# Patient Record
Sex: Female | Born: 1985 | Race: White | Hispanic: No | Marital: Single | Smoking: Current every day smoker
Health system: Southern US, Community
[De-identification: ages and names within clinical notes are randomized; demographics above are authoritative.]

## PROBLEM LIST (undated history)

## (undated) ENCOUNTER — Inpatient Hospital Stay (HOSPITAL_COMMUNITY): Payer: Self-pay

## (undated) DIAGNOSIS — Z789 Other specified health status: Secondary | ICD-10-CM

## (undated) HISTORY — PX: NO PAST SURGERIES: SHX2092

---
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
## 2007-06-27 ENCOUNTER — Other Ambulatory Visit: Admission: RE | Admit: 2007-06-27 | Discharge: 2007-06-27 | Payer: Self-pay | Admitting: Family Medicine

## 2007-06-27 ENCOUNTER — Ambulatory Visit: Payer: Self-pay | Admitting: Family Medicine

## 2007-06-27 ENCOUNTER — Encounter: Payer: Self-pay | Admitting: Family Medicine

## 2007-07-25 ENCOUNTER — Ambulatory Visit: Payer: Self-pay | Admitting: Family Medicine

## 2007-08-15 ENCOUNTER — Ambulatory Visit: Payer: Self-pay | Admitting: Family Medicine

## 2007-11-25 ENCOUNTER — Ambulatory Visit: Payer: Self-pay | Admitting: Family Medicine

## 2007-11-28 ENCOUNTER — Ambulatory Visit: Payer: Self-pay | Admitting: Family Medicine

## 2008-10-08 ENCOUNTER — Emergency Department (HOSPITAL_BASED_OUTPATIENT_CLINIC_OR_DEPARTMENT_OTHER): Admission: EM | Admit: 2008-10-08 | Discharge: 2008-10-08 | Payer: Self-pay | Admitting: Emergency Medicine

## 2010-07-10 LAB — CBC
HCT: 39.6 % (ref 36.0–46.0)
Hemoglobin: 13.6 g/dL (ref 12.0–15.0)
MCHC: 34.4 g/dL (ref 30.0–36.0)
MCV: 92.4 fL (ref 78.0–100.0)
Platelets: 183 10*3/uL (ref 150–400)
RBC: 4.28 MIL/uL (ref 3.87–5.11)
RDW: 12.7 % (ref 11.5–15.5)
WBC: 13.3 10*3/uL — ABNORMAL HIGH (ref 4.0–10.5)

## 2010-07-10 LAB — URINALYSIS, ROUTINE W REFLEX MICROSCOPIC
Bilirubin Urine: NEGATIVE
Glucose, UA: NEGATIVE mg/dL
Hgb urine dipstick: NEGATIVE
Ketones, ur: NEGATIVE mg/dL
Nitrite: NEGATIVE
Protein, ur: NEGATIVE mg/dL
Specific Gravity, Urine: 1.004 — ABNORMAL LOW (ref 1.005–1.030)
Urobilinogen, UA: 0.2 mg/dL (ref 0.0–1.0)
pH: 6.5 (ref 5.0–8.0)

## 2010-07-10 LAB — BASIC METABOLIC PANEL
BUN: 7 mg/dL (ref 6–23)
CO2: 27 mEq/L (ref 19–32)
Chloride: 108 mEq/L (ref 96–112)
Creatinine, Ser: 0.8 mg/dL (ref 0.4–1.2)
Potassium: 4 mEq/L (ref 3.5–5.1)

## 2010-07-10 LAB — BASIC METABOLIC PANEL WITH GFR
Calcium: 9.2 mg/dL (ref 8.4–10.5)
GFR calc Af Amer: >60 mL/min (ref >60)
GFR calc non Af Amer: >60 mL/min (ref >60)
Glucose, Bld: 93 mg/dL (ref 70–99)
Sodium: 144 meq/L (ref 135–145)

## 2010-07-10 LAB — DIFFERENTIAL
Basophils Absolute: 0 K/uL (ref 0.0–0.1)
Basophils Relative: 0 % (ref 0–1)
Eosinophils Absolute: 0 10*3/uL (ref 0.0–0.7)
Eosinophils Relative: 0 % (ref 0–5)
Lymphocytes Relative: 19 % (ref 12–46)
Lymphs Abs: 2.5 10*3/uL (ref 0.7–4.0)
Monocytes Absolute: 0.7 K/uL (ref 0.1–1.0)
Monocytes Relative: 5 % (ref 3–12)
Neutro Abs: 10.1 K/uL — ABNORMAL HIGH (ref 1.7–7.7)
Neutrophils Relative %: 76 % (ref 43–77)

## 2010-07-10 LAB — URINE MICROSCOPIC-ADD ON

## 2010-07-10 LAB — PREGNANCY, URINE: Preg Test, Ur: NEGATIVE

## 2012-07-04 ENCOUNTER — Emergency Department (HOSPITAL_BASED_OUTPATIENT_CLINIC_OR_DEPARTMENT_OTHER)
Admission: EM | Admit: 2012-07-04 | Discharge: 2012-07-05 | Disposition: A | Discharge status: Home / Self Care | Payer: BC Managed Care – PPO | Attending: Emergency Medicine | Admitting: Emergency Medicine

## 2012-07-04 ENCOUNTER — Encounter (HOSPITAL_BASED_OUTPATIENT_CLINIC_OR_DEPARTMENT_OTHER): Payer: Self-pay | Admitting: Emergency Medicine

## 2012-07-04 DIAGNOSIS — L02214 Cutaneous abscess of groin: Secondary | ICD-10-CM

## 2012-07-04 DIAGNOSIS — O9989 Other specified diseases and conditions complicating pregnancy, childbirth and the puerperium: Secondary | ICD-10-CM | POA: Insufficient documentation

## 2012-07-04 DIAGNOSIS — L03319 Cellulitis of trunk, unspecified: Secondary | ICD-10-CM | POA: Insufficient documentation

## 2012-07-04 DIAGNOSIS — O99330 Smoking (tobacco) complicating pregnancy, unspecified trimester: Secondary | ICD-10-CM | POA: Insufficient documentation

## 2012-07-04 DIAGNOSIS — Z79899 Other long term (current) drug therapy: Secondary | ICD-10-CM | POA: Insufficient documentation

## 2012-07-04 DIAGNOSIS — L02219 Cutaneous abscess of trunk, unspecified: Secondary | ICD-10-CM | POA: Insufficient documentation

## 2012-07-04 NOTE — ED Notes (Signed)
Abscess in groin x2 days.  No drainage.

## 2012-07-05 MED ORDER — HYDROCODONE-ACETAMINOPHEN 5-325 MG PO TABS
ORAL_TABLET | ORAL | Status: AC
  Administered 2012-07-05: 1 via ORAL
  Filled 2012-07-05: qty 1

## 2012-07-05 MED ORDER — HYDROCODONE-ACETAMINOPHEN 5-325 MG PO TABS
1.0000 | ORAL_TABLET | Freq: Once | ORAL | Status: AC
  Administered 2012-07-05: 1 via ORAL

## 2012-07-05 NOTE — ED Notes (Signed)
MD at bedside. 

## 2012-07-05 NOTE — ED Provider Notes (Signed)
History     CSN: [id number]  Arrival date & time 07/04/12  2347   First MD Initiated Contact with Patient 07/05/12 0001      Chief Complaint  Patient presents with  . Abscess    (Consider location/radiation/quality/duration/timing/severity/associated sxs/prior treatment) HPI This is a 27 year old female who is about [redacted] weeks pregnant. She has a 2 day history of an abscess in the right groin fold. The abscess is pointing. There is moderate to severe pain associated with it particularly when walking. She denies systemic symptoms such as fever or chills. There's been no drainage.  History reviewed. No pertinent past medical history.  History reviewed. No pertinent past surgical history.  No family history on file.  History  Substance Use Topics  . Smoking status: Current Every Day Smoker -- 0.75 packs/day  . Smokeless tobacco: Not on file  . Alcohol Use: No    OB History   Grav Para Term Preterm Abortions TAB SAB Ect Mult Living   1               Review of Systems  All other systems reviewed and are negative.    Allergies  Latex; Betadine; and Iodine  Home Medications   Current Outpatient Rx  Name  Route  Sig  Dispense  Refill  . ferrous sulfate 325 (65 FE) MG EC tablet   Oral   Take 325 mg by mouth 3 (three) times daily with meals.         . Prenatal Vit-Fe Fumarate-FA (PRENATAL MULTIVITAMIN) TABS   Oral   Take 1 tablet by mouth daily at 12 noon.           BP 136/69  Pulse 92  Temp(Src) 98.5 F (36.9 C) (Oral)  Resp 20  Ht 5\' 8"  (1.727 m)  Wt 215 lb (97.523 kg)  BMI 32.7 kg/m2  SpO2 98%  Physical Exam General: Well-developed, well-nourished female in no acute distress; appearance consistent with age of record HENT: normocephalic, atraumatic Eyes: Normal appearance Neck: supple Heart: regular rate and rhythm Lungs: clear to auscultation bilaterally Abdomen: soft; gravid, consistent with dates; nontender; bowel sounds present GU: Normal  external genitalia; the pointing abscess of right groin fold without involvement of the right labium majus Extremities: No deformity; full range of motion Neurologic: Awake, alert and oriented; motor function intact in all extremities and symmetric; no facial droop Skin: Warm and dry Psychiatric: Normal mood and affect    ED Course  Procedures (including critical care time)  INCISION AND DRAINAGE Performed by: Paula Libra L Consent: Verbal consent obtained. Risks and benefits: risks, benefits and alternatives were discussed Type: abscess  Body area: Right groin fold  Anesthesia: local infiltration  Incision was made with a scalpel.  Local anesthetic: lidocaine 2 % with epinephrine  Anesthetic total: 2 ml  Complexity: complex Blunt dissection to break up loculations  Drainage: purulent, foul-smelling   Drainage amount: Moderate   Packing material: 1/4 in iodoform gauze  Patient tolerance: Patient tolerated the procedure well with no immediate complications.     MDM   Patient was advised to remove the packing in 3 days. She was advised to return to the ED if symptoms worsen rub improve during that period.       Hanley Seamen, MD 07/05/12 [phone]

## 2012-07-05 NOTE — ED Notes (Signed)
Assisted MD with I&D. Pt tolerated well.

## 2012-10-15 ENCOUNTER — Inpatient Hospital Stay (HOSPITAL_COMMUNITY)
Admission: AD | Admit: 2012-10-15 | Discharge: 2012-10-15 | Disposition: A | Discharge status: Home / Self Care | Payer: BC Managed Care – PPO | Source: Ambulatory Visit | Attending: Family Medicine | Admitting: Family Medicine

## 2012-10-15 ENCOUNTER — Encounter (HOSPITAL_COMMUNITY): Payer: Self-pay

## 2012-10-15 DIAGNOSIS — IMO0002 Reserved for concepts with insufficient information to code with codable children: Secondary | ICD-10-CM | POA: Insufficient documentation

## 2012-10-15 DIAGNOSIS — M79609 Pain in unspecified limb: Secondary | ICD-10-CM | POA: Insufficient documentation

## 2012-10-15 DIAGNOSIS — R609 Edema, unspecified: Secondary | ICD-10-CM

## 2012-10-15 DIAGNOSIS — R600 Localized edema: Secondary | ICD-10-CM

## 2012-10-15 HISTORY — DX: Other specified health status: Z78.9

## 2012-10-15 LAB — URINE MICROSCOPIC-ADD ON

## 2012-10-15 LAB — URINALYSIS, ROUTINE W REFLEX MICROSCOPIC: Nitrite: NEGATIVE

## 2012-10-15 MED ORDER — ENOXAPARIN SODIUM 80 MG/0.8ML ~~LOC~~ SOLN
80.0000 mg | Freq: Once | SUBCUTANEOUS | Status: AC
  Administered 2012-10-15: 80 mg via SUBCUTANEOUS
  Filled 2012-10-15: qty 0.8

## 2012-10-15 NOTE — MAU Note (Signed)
Patient gets her prenatal care in [place]. States she has been having increasing swelling in both legs and feet and now having pain in both legs. Denies contractions, bleeding and has a little vaginal discharge. Some mild back pain. Patient reports good fetal movement.

## 2012-10-15 NOTE — MAU Provider Note (Signed)
History     CSN: [id number]  Arrival date and time: 10/15/12 1550   First Provider Initiated Contact with Patient 10/15/12 1913      Chief Complaint  Patient presents with  . Leg Swelling  . Leg Pain   HPI This is a 27 y.o. female at [redacted]w[redacted]d who presents with c/o bilateral leg swelling, calf pain, intermittent numbness of legs and feet and numbness of hands (intermittent). States legs have been swollen entire pregnancy but worse over last few days. States has trouble walking due to swelling and numbness. States gets care in [place] but was here visiting mom so came here. No dyspnea.   RN Note: Patient gets her prenatal care in [place]. States she has been having increasing swelling in both legs and feet and now having pain in both legs. Denies contractions, bleeding and has a little vaginal discharge. Some mild back pain. Patient reports good fetal movement.       OB History   Grav Para Term Preterm Abortions TAB SAB Ect Mult Living   1 0 0 0 0 0 0 0 0 0       Past Medical History  Diagnosis Date  . Medical history non-contributory     Past Surgical History  Procedure Laterality Date  . No past surgeries      No family history on file.  History  Substance Use Topics  . Smoking status: Current Every Day Smoker -- 0.75 packs/day    Types: Cigarettes  . Smokeless tobacco: Not on file  . Alcohol Use: No    Allergies:  Allergies  Allergen Reactions  . Betadine (Povidone Iodine) Rash  . Iodine Rash  . Latex Rash    Prescriptions prior to admission  Medication Sig Dispense Refill  . acetaminophen (TYLENOL) 500 MG tablet Take 1,000 mg by mouth every 6 (six) hours as needed for pain.      Burnis Medin FePoly-FeHemPo-FA-Omega (HEMENATAL OB + DHA) 28-6-1 & 203 MG MISC Take 1 tablet by mouth 2 (two) times daily.      . Prenatal Vit-Fe Fumarate-FA (PRENATAL MULTIVITAMIN) TABS Take 1 tablet by mouth daily at 12 noon.        Review of Systems  Constitutional: Negative for  fever, chills and malaise/fatigue.  Respiratory: Negative for cough and shortness of breath.   Cardiovascular: Positive for leg swelling. Negative for chest pain.  Gastrointestinal: Negative for nausea, vomiting, abdominal pain, diarrhea and constipation.  Neurological: Positive for tingling, sensory change and weakness. Negative for dizziness and headaches.   Physical Exam   Blood pressure 124/65, pulse 69, temperature 98 F (36.7 C), temperature source Oral, resp. rate 18, height 5\' 6"  (1.676 m), weight 96.707 kg (213 lb 3.2 oz), SpO2 99.00%.  Physical Exam  Constitutional: She appears well-developed and well-nourished. No distress.  HENT:  Head: Normocephalic.  Cardiovascular: Normal rate, regular rhythm and normal heart sounds.   Respiratory: Effort normal and breath sounds normal. No respiratory distress. She has no wheezes. She has no rales.  GI: Soft. She exhibits no distension. There is no tenderness. There is no rebound and no guarding.  Musculoskeletal: Normal range of motion. She exhibits edema and tenderness.  1-2+ edema from knees to feet, bilaterally with left slightly more than right. + calf pain bilaterally with dorsiflexion. 2+ pulses in dorsalis pedis and Posterior tibials +  Popliteal pulses Normal plantar and dorsiflexion strength bilaterally    MAU Course  Procedures  MDM Discussed with Dr Shawnie Pons. There is some suspicion  of DVT based on pain and swelling, though this is bilateral. Explained to patient that we cannot rule out DVT however, without doppler studies. Offered Lovenox dose tonight with Dopplers in am.  She wants to go to [place] to do dopplers Instructed to call her doctor tonight to have him order that.  Assessment and Plan  A:  [facility] at [redacted]w[redacted]d       Bilateral leg edema      Cannot rule out DVT  P:  Discharge      Lovenox given      Dopplers tomorrow      Come back here if not able to arrange  [facility] 10/15/2012, 7:45 PM

## 2012-10-17 NOTE — MAU Provider Note (Signed)
Chart reviewed and agree with management and plan.  

## 2013-08-20 ENCOUNTER — Encounter (HOSPITAL_COMMUNITY): Payer: Self-pay | Admitting: *Deleted

## 2014-02-02 ENCOUNTER — Encounter (HOSPITAL_COMMUNITY): Payer: Self-pay | Admitting: *Deleted

## 2014-12-25 ENCOUNTER — Emergency Department (HOSPITAL_BASED_OUTPATIENT_CLINIC_OR_DEPARTMENT_OTHER)
Admission: EM | Admit: 2014-12-25 | Discharge: 2014-12-26 | Disposition: A | Discharge status: Home / Self Care | Payer: BLUE CROSS/BLUE SHIELD | Attending: Emergency Medicine | Admitting: Emergency Medicine

## 2014-12-25 ENCOUNTER — Encounter (HOSPITAL_BASED_OUTPATIENT_CLINIC_OR_DEPARTMENT_OTHER): Payer: Self-pay | Admitting: *Deleted

## 2014-12-25 DIAGNOSIS — Z720 Tobacco use: Secondary | ICD-10-CM | POA: Diagnosis not present

## 2014-12-25 DIAGNOSIS — L02412 Cutaneous abscess of left axilla: Secondary | ICD-10-CM | POA: Diagnosis present

## 2014-12-25 DIAGNOSIS — Z91040 Latex allergy status: Secondary | ICD-10-CM | POA: Diagnosis not present

## 2014-12-25 DIAGNOSIS — Z79899 Other long term (current) drug therapy: Secondary | ICD-10-CM | POA: Insufficient documentation

## 2014-12-25 MED ORDER — HYDROCODONE-ACETAMINOPHEN 5-325 MG PO TABS
2.0000 | ORAL_TABLET | Freq: Once | ORAL | Status: AC
  Administered 2014-12-25: 2 via ORAL
  Filled 2014-12-25: qty 2

## 2014-12-25 MED ORDER — IBUPROFEN 400 MG PO TABS
400.0000 mg | ORAL_TABLET | Freq: Once | ORAL | Status: AC
  Administered 2014-12-25: 400 mg via ORAL
  Filled 2014-12-25: qty 1

## 2014-12-25 MED ORDER — DOXYCYCLINE HYCLATE 100 MG PO CAPS: 100.0000 mg | ORAL_CAPSULE | Freq: Two times a day (BID) | ORAL | Status: AC

## 2014-12-25 MED ORDER — DOXYCYCLINE HYCLATE 100 MG PO TABS
100.0000 mg | ORAL_TABLET | Freq: Once | ORAL | Status: AC
  Administered 2014-12-25: 100 mg via ORAL
  Filled 2014-12-25: qty 1

## 2014-12-25 NOTE — ED Provider Notes (Signed)
CSN: [id number]     Arrival date & time 12/25/14  2246 History   First MD Initiated Contact with Patient 12/25/14 2325     Chief Complaint  Patient presents with  . Abscess     (Consider location/radiation/quality/duration/timing/severity/associated sxs/prior Treatment) Patient is a 29 y.o. female presenting with abscess. The history is provided by the patient.  Abscess Associated symptoms: no fever, no nausea and no vomiting   Patient c/o abscesses to left axilla. Hx prior abscess, no known hx mrsa.  Pt states symptoms present x 1 week. Gradual onset, persistent, slowly worse. 1 area has drained on its own, 2 others also present and slowly getting larger. Pain moderate, worse w palpation. Overall does not feel sick or ill. No nv. No weakness. No fever or chills. No sweats.  No trauma to area. Does shave arm hair.       Past Medical History  Diagnosis Date  . Medical history non-contributory    Past Surgical History  Procedure Laterality Date  . No past surgeries     No family history on file. Social History  Substance Use Topics  . Smoking status: Current Every Day Smoker -- 0.75 packs/day    Types: Cigarettes  . Smokeless tobacco: None  . Alcohol Use: No   OB History    Gravida Para Term Preterm AB TAB SAB Ectopic Multiple Living       Review of Systems  Constitutional: Negative for fever, chills and diaphoresis.  Gastrointestinal: Negative for nausea and vomiting.  Skin: Negative for rash.  Neurological: Negative for weakness, light-headedness and numbness.      Allergies  Betadine; Iodine; and Latex  Home Medications   Prior to Admission medications   Medication Sig Start Date End Date Taking? Authorizing Jerold Yoss  acetaminophen (TYLENOL) 500 MG tablet Take 1,000 mg by mouth every 6 (six) hours as needed for pain.    Historical Criag Wicklund, MD  Prenat FePoly-FeHemPo-FA-Omega (HEMENATAL OB + DHA) 28-6-1 & 203 MG MISC Take 1 tablet by mouth  2 (two) times daily.    Historical Shadrack Brummitt, MD  Prenatal Vit-Fe Fumarate-FA (PRENATAL MULTIVITAMIN) TABS Take 1 tablet by mouth daily at 12 noon.    Historical Jarvis Sawa, MD   BP 156/90 mmHg  Pulse 74  Temp(Src) 98.1 F (36.7 C) (Oral)  Resp 20  Ht  (1.702 m)  Wt 200 lb (90.719 kg)  BMI 31.32 kg/m2  SpO2 100% Physical Exam  Constitutional: She appears well-developed and well-nourished. No distress.  Eyes: Conjunctivae are normal. No scleral icterus.  Neck: Neck supple. No tracheal deviation present.  Cardiovascular: Normal rate.   Pulmonary/Chest: Effort normal. No respiratory distress.  Abdominal: Normal appearance.  Musculoskeletal:  Abscess left axilla, largest approx 3-4 cm diameter, 2 smaller 1-1.5 cm diameter abscesses. inflamm to area. No necrotic or devitalized tissue.  No crepitus. No lymphangitis. Distal pulses palp.   Neurological: She is alert.  Skin: Skin is warm and dry. No rash noted. She is not diaphoretic.  Psychiatric: She has a normal mood and affect.  Nursing note and vitals reviewed.   ED Course  Procedures (including critical care time) Labs Review   MDM   Pt has ride, does not have to drive home.  No meds pta.  Hydrocodone po. Motrin po.  Given several areas abscess/inflamm, will rx doxy.  INCISION AND DRAINAGE Performed by: Suzi Roots Consent: Verbal consent obtained. Risks and benefits: risks, benefits and alternatives were  discussed Type: abscess x 3  Body area: left axilla  Anesthesia: local infiltration  Incision was made with a scalpel.  Local anesthetic: lidocaine 2% w epinephrine  Anesthetic total: 6 ml  Complexity: complex Blunt dissection to break up loculations  Drainage: purulent  Drainage amount: large  Packing material: 1/4 in iodoform gauze  Patient tolerance: Patient tolerated the procedure well with no immediate complications.  Sterile dressing.  Pt states has pcp appt this Monday already arranged-   Will keep for recheck and packing removal.     Cathren Laine, MD 12/25/14 [phone]

## 2014-12-25 NOTE — ED Notes (Signed)
Abscess to his left axilla for a week.

## 2014-12-26 NOTE — Discharge Instructions (Signed)
It was our pleasure to provide your ER care today - we hope that you feel better.  Keep area very clean.  Wash with antibacterial soap 1-2x/day. Warm compresses to sore area.  Take motrin or aleve as need for pain.  Take antibiotic (doxycycline) as prescribed.  As you have developed abscess, in future, you may want to use hair trimmer, rather than shaving these areas.  Follow up with your doctor Monday as planned - have them check wound and remove packing.  Return to ER right away if worse, new symptoms, fevers, spreading redness, increased swelling, severe/intractable pain, other concern.  You were given pain medication in the ER - no driving for the next 4 hours.      Abscess An abscess is an infected area that contains a collection of pus and debris.It can occur in almost any part of the body. An abscess is also known as a furuncle or boil. CAUSES  An abscess occurs when tissue gets infected. This can occur from blockage of oil or sweat glands, infection of hair follicles, or a minor injury to the skin. As the body tries to fight the infection, pus collects in the area and creates pressure under the skin. This pressure causes pain. People with weakened immune systems have difficulty fighting infections and get certain abscesses more often.  SYMPTOMS Usually an abscess develops on the skin and becomes a painful mass that is red, warm, and tender. If the abscess forms under the skin, you may feel a moveable soft area under the skin. Some abscesses break open (rupture) on their own, but most will continue to get worse without care. The infection can spread deeper into the body and eventually into the bloodstream, causing you to feel ill.  DIAGNOSIS  Your caregiver will take your medical history and perform a physical exam. A sample of fluid may also be taken from the abscess to determine what is causing your infection. TREATMENT  Your caregiver may prescribe antibiotic medicines to  fight the infection. However, taking antibiotics alone usually does not cure an abscess. Your caregiver may need to make a small cut (incision) in the abscess to drain the pus. In some cases, gauze is packed into the abscess to reduce pain and to continue draining the area. HOME CARE INSTRUCTIONS   Only take over-the-counter or prescription medicines for pain, discomfort, or fever as directed by your caregiver.  If you were prescribed antibiotics, take them as directed. Finish them even if you start to feel better.  If gauze is used, follow your caregiver's directions for changing the gauze.  To avoid spreading the infection:  Keep your draining abscess covered with a bandage.  Wash your hands well.  Do not share personal care items, towels, or whirlpools with others.  Avoid skin contact with others.  Keep your skin and clothes clean around the abscess.  Keep all follow-up appointments as directed by your caregiver. SEEK MEDICAL CARE IF:   You have increased pain, swelling, redness, fluid drainage, or bleeding.  You have muscle aches, chills, or a general ill feeling.  You have a fever. MAKE SURE YOU:   Understand these instructions.  Will watch your condition.  Will get help right away if you are not doing well or get worse. Document Released: 12/28/2004 Document Revised: 09/19/2011 Document Reviewed: 06/02/2011 [facility] Patient Information 2015 [place], [place]. This information is not intended to replace advice given to you by your health care provider. Make sure you discuss any questions you  have with your health care provider.

## 2017-05-18 ENCOUNTER — Other Ambulatory Visit: Payer: Self-pay

## 2017-05-18 ENCOUNTER — Encounter (HOSPITAL_COMMUNITY): Payer: Self-pay | Admitting: Emergency Medicine

## 2017-05-18 DIAGNOSIS — S060X1A Concussion with loss of consciousness of 30 minutes or less, initial encounter: Secondary | ICD-10-CM | POA: Diagnosis not present

## 2017-05-18 DIAGNOSIS — S0003XA Contusion of scalp, initial encounter: Secondary | ICD-10-CM | POA: Insufficient documentation

## 2017-05-18 DIAGNOSIS — W0110XA Fall on same level from slipping, tripping and stumbling with subsequent striking against unspecified object, initial encounter: Secondary | ICD-10-CM | POA: Diagnosis not present

## 2017-05-18 DIAGNOSIS — Y999 Unspecified external cause status: Secondary | ICD-10-CM | POA: Diagnosis not present

## 2017-05-18 DIAGNOSIS — S0990XA Unspecified injury of head, initial encounter: Secondary | ICD-10-CM | POA: Diagnosis present

## 2017-05-18 DIAGNOSIS — Z91040 Latex allergy status: Secondary | ICD-10-CM | POA: Diagnosis not present

## 2017-05-18 DIAGNOSIS — M542 Cervicalgia: Secondary | ICD-10-CM | POA: Diagnosis not present

## 2017-05-18 DIAGNOSIS — R55 Syncope and collapse: Secondary | ICD-10-CM | POA: Diagnosis not present

## 2017-05-18 DIAGNOSIS — Y9389 Activity, other specified: Secondary | ICD-10-CM | POA: Insufficient documentation

## 2017-05-18 DIAGNOSIS — R197 Diarrhea, unspecified: Secondary | ICD-10-CM | POA: Insufficient documentation

## 2017-05-18 DIAGNOSIS — Y929 Unspecified place or not applicable: Secondary | ICD-10-CM | POA: Diagnosis not present

## 2017-05-18 DIAGNOSIS — Z79899 Other long term (current) drug therapy: Secondary | ICD-10-CM | POA: Diagnosis not present

## 2017-05-18 DIAGNOSIS — F17210 Nicotine dependence, cigarettes, uncomplicated: Secondary | ICD-10-CM | POA: Diagnosis not present

## 2017-05-18 LAB — LIPASE, BLOOD: LIPASE: 25 U/L (ref 11–51)

## 2017-05-18 LAB — COMPREHENSIVE METABOLIC PANEL
ALBUMIN: 4.1 g/dL (ref 3.5–5.0)
ALT: 18 U/L (ref 14–54)
AST: 18 U/L (ref 15–41)
Alkaline Phosphatase: 75 U/L (ref 38–126)
Anion gap: 9 (ref 5–15)
BUN: 13 mg/dL (ref 6–20)
CHLORIDE: 108 mmol/L (ref 101–111)
CO2: 20 mmol/L — ABNORMAL LOW (ref 22–32)
CREATININE: 0.81 mg/dL (ref 0.44–1.00)
Calcium: 8.9 mg/dL (ref 8.9–10.3)
GFR calc Af Amer: >60 mL/min (ref >60)
GLUCOSE: 151 mg/dL — AB (ref 65–99)
POTASSIUM: 4.2 mmol/L (ref 3.5–5.1)
Sodium: 137 mmol/L (ref 135–145)
Total Bilirubin: 0.6 mg/dL (ref 0.3–1.2)
Total Protein: 7 g/dL (ref 6.5–8.1)

## 2017-05-18 LAB — URINALYSIS, ROUTINE W REFLEX MICROSCOPIC
BACTERIA UA: NONE SEEN
BILIRUBIN URINE: NEGATIVE
Glucose, UA: NEGATIVE mg/dL
Hgb urine dipstick: NEGATIVE
Ketones, ur: NEGATIVE mg/dL
Nitrite: NEGATIVE
PH: 5 (ref 5.0–8.0)
Protein, ur: NEGATIVE mg/dL
SPECIFIC GRAVITY, URINE: 1.025 (ref 1.005–1.030)

## 2017-05-18 LAB — CBC
HEMATOCRIT: 43.2 % (ref 36.0–46.0)
Hemoglobin: 14.7 g/dL (ref 12.0–15.0)
MCH: 32.4 pg (ref 26.0–34.0)
MCHC: 34 g/dL (ref 30.0–36.0)
MCV: 95.2 fL (ref 78.0–100.0)
PLATELETS: 178 10*3/uL (ref 150–400)
RBC: 4.54 MIL/uL (ref 3.87–5.11)
RDW: 14.3 % (ref 11.5–15.5)
WBC: 17.2 10*3/uL — AB (ref 4.0–10.5)

## 2017-05-18 LAB — I-STAT BETA HCG BLOOD, ED (MC, WL, AP ONLY): I-stat hCG, quantitative: <5 m[IU]/mL (ref <5)

## 2017-05-18 MED ORDER — IBUPROFEN 400 MG PO TABS
400.0000 mg | ORAL_TABLET | Freq: Once | ORAL | Status: AC | PRN
Start: 2017-05-18 — End: 2017-05-18
  Administered 2017-05-18: 400 mg via ORAL
  Filled 2017-05-18: qty 1

## 2017-05-18 MED ORDER — ONDANSETRON 4 MG PO TBDP
4.0000 mg | ORAL_TABLET | Freq: Once | ORAL | Status: AC | PRN
  Administered 2017-05-18: 4 mg via ORAL
  Filled 2017-05-18: qty 1

## 2017-05-18 NOTE — ED Triage Notes (Signed)
Pt c/o vomiting x 5 that began about 3 hours PTA. C/o headache and continued nausea. Denies diarrhea, urinary symptoms.

## 2017-05-19 ENCOUNTER — Emergency Department (HOSPITAL_COMMUNITY): Payer: BLUE CROSS/BLUE SHIELD

## 2017-05-19 ENCOUNTER — Emergency Department (HOSPITAL_COMMUNITY)
Admission: EM | Admit: 2017-05-19 | Discharge: 2017-05-19 | Disposition: A | Discharge status: Home / Self Care | Payer: BLUE CROSS/BLUE SHIELD | Attending: Emergency Medicine | Admitting: Emergency Medicine

## 2017-05-19 DIAGNOSIS — S0003XA Contusion of scalp, initial encounter: Secondary | ICD-10-CM

## 2017-05-19 DIAGNOSIS — R112 Nausea with vomiting, unspecified: Secondary | ICD-10-CM

## 2017-05-19 DIAGNOSIS — R197 Diarrhea, unspecified: Secondary | ICD-10-CM

## 2017-05-19 DIAGNOSIS — R55 Syncope and collapse: Secondary | ICD-10-CM

## 2017-05-19 DIAGNOSIS — S060X1A Concussion with loss of consciousness of 30 minutes or less, initial encounter: Secondary | ICD-10-CM

## 2017-05-19 MED ORDER — FENTANYL CITRATE (PF) 100 MCG/2ML IJ SOLN
50.0000 ug | Freq: Once | INTRAMUSCULAR | Status: AC
  Administered 2017-05-19: 50 ug via INTRAVENOUS
  Filled 2017-05-19: qty 2

## 2017-05-19 MED ORDER — ONDANSETRON HCL 4 MG PO TABS: 4.0000 mg | ORAL_TABLET | Freq: Three times a day (TID) | ORAL | 0 refills | Status: AC | PRN

## 2017-05-19 MED ORDER — ONDANSETRON HCL 4 MG/2ML IJ SOLN
4.0000 mg | Freq: Once | INTRAMUSCULAR | Status: AC
  Administered 2017-05-19: 4 mg via INTRAVENOUS
  Filled 2017-05-19: qty 2

## 2017-05-19 MED ORDER — MELOXICAM 15 MG PO TABS: 15.0000 mg | ORAL_TABLET | Freq: Every day | ORAL | 0 refills | Status: AC

## 2017-05-19 NOTE — ED Provider Notes (Signed)
[facility] EMERGENCY DEPARTMENT Provider Note   CSN: [id number] Arrival date & time: 05/18/17  2123     History   Chief Complaint Chief Complaint  Patient presents with  . Emesis    HPI Laurie Jefferson is a 32 y.o. female.  Who presents the emergency department with chief complaint of nausea, vomiting and headache.  Patient states that she developed diarrhea yesterday.  Last evening she developed vomiting.  She had several episodes of nonbloody non-bilious vomitus.  She states that during 1 of the episodes she lost consciousness and fell back hitting her head.  She complains of scalp hematoma and had some blurry vision lasting about 15 minutes after hitting her head.  She also complains of neck pain but denies any bilateral upper extremity weakness or numbness.  Patient states that she has had episodes of passing out with vomiting in the past and this is not something new.  She denies chest pain, shortness of breath, racing heart. Patient has not had any diarrhea for the past 10 hours and her vomiting has resolved after Zofran.  She denies any current nausea or abdominal pain. HPI  Past Medical History:  Diagnosis Date  . Medical history non-contributory     There are no active problems to display for this patient.   Past Surgical History:  Procedure Laterality Date  . NO PAST SURGERIES      OB History    Gravida Para Term Preterm AB Living   1 0 0 0 0 0   SAB TAB Ectopic Multiple Live Births   0 0 0 0         Home Medications    Prior to Admission medications   Medication Sig Start Date End Date Taking? Authorizing Provider  acetaminophen (TYLENOL) 500 MG tablet Take 1,000 mg by mouth every 6 (six) hours as needed for pain.    [provider]  doxycycline (VIBRAMYCIN) 100 MG capsule Take 1 capsule (100 mg total) by mouth 2 (two) times daily. 12/25/14   Cathren Laine, MD  Prenat FePoly-FeHemPo-FA-Omega (HEMENATAL OB + DHA) 28-6-1 & 203 MG  MISC Take 1 tablet by mouth 2 (two) times daily.    [provider]  Prenatal Vit-Fe Fumarate-FA (PRENATAL MULTIVITAMIN) TABS Take 1 tablet by mouth daily at 12 noon.    [provider]    Family History No family history on file.  Social History Social History   Tobacco Use  . Smoking status: Current Every Day Smoker    Packs/day: 0.75    Types: Cigarettes  . Smokeless tobacco: Never Used  Substance Use Topics  . Alcohol use: No  . Drug use: Not on file     Allergies   Betadine [povidone iodine]; Iodine; and Latex   Review of Systems Review of Systems  Ten systems reviewed and are negative for acute change, except as noted in the HPI.   Physical Exam Updated Vital Signs BP 106/60 (BP Location: Right Arm)   Pulse 67   Temp 99.2 F (37.3 C) (Oral)   Resp 18   Ht 5\' 7"  (1.702 m)   Wt 95.3 kg (210 lb)   LMP 05/11/2017   SpO2 100%   BMI 32.89 kg/m   Physical Exam  Constitutional: She is oriented to person, place, and time. She appears well-developed and well-nourished. No distress.  HENT:  Head: Normocephalic.  Post scalp hematoma  Eyes: Conjunctivae are normal. No scleral icterus.  Neck: Normal range of motion.  Cardiovascular: Normal rate, regular rhythm and normal heart sounds. Exam reveals no gallop and no friction rub.  No murmur heard. Pulmonary/Chest: Effort normal and breath sounds normal. No respiratory distress.  Abdominal: Soft. Bowel sounds are normal. She exhibits no distension and no mass. There is no tenderness. There is no guarding.  Musculoskeletal: Normal range of motion.  Neurological: She is alert and oriented to person, place, and time.  Speech is clear and goal oriented, follows commands Major Cranial nerves without deficit, no facial droop Normal strength in upper and lower extremities bilaterally including dorsiflexion and plantar flexion, strong and equal grip strength Sensation normal to light and sharp touch Moves  extremities without ataxia, coordination intact Normal finger to nose and rapid alternating movements Neg romberg, no pronator drift Normal gait Normal heel-shin and balance   Skin: Skin is warm and dry. She is not diaphoretic.  Psychiatric: Her behavior is normal.  Nursing note and vitals reviewed.    ED Treatments / Results  Labs (all labs ordered are listed, but only abnormal results are displayed) Labs Reviewed  COMPREHENSIVE METABOLIC PANEL - Abnormal; Notable for the following components:      Result Value   CO2 20 (*)    Glucose, Bld 151 (*)    All other components within normal limits  CBC - Abnormal; Notable for the following components:   WBC 17.2 (*)    All other components within normal limits  URINALYSIS, ROUTINE W REFLEX MICROSCOPIC - Abnormal; Notable for the following components:   APPearance HAZY (*)    Leukocytes, UA SMALL (*)    Squamous Epithelial / LPF 6-30 (*)    All other components within normal limits  LIPASE, BLOOD  I-STAT BETA HCG BLOOD, ED ([facility], [facility], [facility] ONLY)    EKG  EKG Interpretation None       Radiology Ct Head Wo Contrast  Result Date: 05/19/2017 CLINICAL DATA:  Vomiting, headache.  Head trauma. EXAM: CT HEAD WITHOUT CONTRAST CT CERVICAL SPINE WITHOUT CONTRAST TECHNIQUE: Multidetector CT imaging of the head and cervical spine was performed following the standard protocol without intravenous contrast. Multiplanar CT image reconstructions of the cervical spine were also generated. COMPARISON:  CT HEAD April 20, 2003 FINDINGS: CT HEAD FINDINGS BRAIN: No intraparenchymal hemorrhage, mass effect nor midline shift. The ventricles and sulci are normal. No acute large vascular territory infarcts. No abnormal extra-axial fluid collections. Basal cisterns are patent. VASCULAR: Unremarkable. SKULL/SOFT TISSUES: No skull fracture. Mild posterior vertex scalp hematoma without subcutaneous gas or radiopaque foreign bodies. ORBITS/SINUSES: The included  ocular globes and orbital contents are normal.LEFT maxillary sinus mucosal thickening, mild ethmoid, sphenoid mucosal thickening. Mastoid air cells are well aerated. OTHER: None. CT CERVICAL SPINE FINDINGS ALIGNMENT: Straightened lordosis. Vertebral bodies in alignment. Mild broad dextroscoliosis, may be positional. SKULL BASE AND VERTEBRAE: Cervical vertebral bodies and posterior elements are intact. RIGHT C3-4 uncovertebral hypertrophy. Intervertebral disc heights preserved. No destructive bony lesions. C1-2 articulation maintained. SOFT TISSUES AND SPINAL CANAL: Normal. DISC LEVELS: No significant osseous canal stenosis or neural foraminal narrowing. UPPER CHEST: Lung apices are clear. OTHER: None. IMPRESSION: 1. Mild vertex scalp hematoma, otherwise negative noncontrast CT HEAD. 2. Negative noncontrast CT CERVICAL SPINE. Electronically Signed   By: Awilda Metro M.D.   On: 05/19/2017 04:51   Ct Cervical Spine Wo Contrast  Result Date: 05/19/2017 CLINICAL DATA:  Vomiting, headache.  Head trauma. EXAM: CT HEAD WITHOUT CONTRAST CT CERVICAL SPINE WITHOUT CONTRAST TECHNIQUE: Multidetector CT imaging of the head and cervical  spine was performed following the standard protocol without intravenous contrast. Multiplanar CT image reconstructions of the cervical spine were also generated. COMPARISON:  CT HEAD April 20, 2003 FINDINGS: CT HEAD FINDINGS BRAIN: No intraparenchymal hemorrhage, mass effect nor midline shift. The ventricles and sulci are normal. No acute large vascular territory infarcts. No abnormal extra-axial fluid collections. Basal cisterns are patent. VASCULAR: Unremarkable. SKULL/SOFT TISSUES: No skull fracture. Mild posterior vertex scalp hematoma without subcutaneous gas or radiopaque foreign bodies. ORBITS/SINUSES: The included ocular globes and orbital contents are normal.LEFT maxillary sinus mucosal thickening, mild ethmoid, sphenoid mucosal thickening. Mastoid air cells are well aerated.  OTHER: None. CT CERVICAL SPINE FINDINGS ALIGNMENT: Straightened lordosis. Vertebral bodies in alignment. Mild broad dextroscoliosis, may be positional. SKULL BASE AND VERTEBRAE: Cervical vertebral bodies and posterior elements are intact. RIGHT C3-4 uncovertebral hypertrophy. Intervertebral disc heights preserved. No destructive bony lesions. C1-2 articulation maintained. SOFT TISSUES AND SPINAL CANAL: Normal. DISC LEVELS: No significant osseous canal stenosis or neural foraminal narrowing. UPPER CHEST: Lung apices are clear. OTHER: None. IMPRESSION: 1. Mild vertex scalp hematoma, otherwise negative noncontrast CT HEAD. 2. Negative noncontrast CT CERVICAL SPINE. Electronically Signed   By: Awilda Metroourtnay  Bloomer M.D.   On: 05/19/2017 04:51    Procedures Procedures (including critical care time)  Medications Ordered in ED Medications  ondansetron (ZOFRAN-ODT) disintegrating tablet 4 mg (4 mg Oral Given 05/18/17 2138)  ibuprofen (ADVIL,MOTRIN) tablet 400 mg (400 mg Oral Given 05/18/17 2138)  fentaNYL (SUBLIMAZE) injection 50 mcg (50 mcg Intravenous Given 05/19/17 0341)  ondansetron (ZOFRAN) injection 4 mg (4 mg Intravenous Given 05/19/17 0341)     Initial Impression / Assessment and Plan / ED Course  I have reviewed the triage vital signs and the nursing notes.  Pertinent labs & imaging results that were available during my care of the patient were reviewed by me and considered in my medical decision making (see chart for details).    With vasovagal syncope.  Her CT scans are negative.  Normal neurologic examination.  Patient likely has diagnosis of viral gastroenteritis as her symptoms have resolved.  No active vomiting.  She does not have any dizziness with ambulation and appears appropriate for discharge at this time.  I discussed return precautions with the patient.  Final Clinical Impressions(s) / ED Diagnoses   Final diagnoses:  Vasovagal syncope  Scalp hematoma, initial encounter  Nausea  vomiting and diarrhea  Concussion with loss of consciousness of 30 minutes or less, initial encounter    ED Discharge Orders    None       Arthor CaptainHarris, Maggie Senseney, PA-C 05/19/17 [id number]    Gilda CreasePollina, Christopher J, MD 05/19/17 [phone]

## 2017-05-19 NOTE — Discharge Instructions (Signed)
Your CT scan was negative. Please read the attached information regarding your diagnosis. Get help right away if: You have chest pain. You feel extremely weak or you faint. You see blood in your vomit. Your vomit looks like coffee grounds. You have bloody or black stools or stools that look like tar. You have a severe headache, a stiff neck, or both. You have a rash. You have severe pain, cramping, or bloating in your abdomen. You have trouble breathing or you are breathing very quickly. Your heart is beating very quickly. Your skin feels cold and clammy. You feel confused. You have pain when you urinate. You have signs of dehydration, such as: Dark urine, very little urine, or no urine. Cracked lips. Dry mouth. Sunken eyes. Sleepiness. Weakness.

## 2018-12-21 IMAGING — CT CT HEAD W/O CM
5 of 8 series · 17 of 47 positions shown, 18 images · non-contrast
Comparison: CT HEAD April 20, 2003

CLINICAL DATA: Vomiting, headache.  Head trauma.

EXAM:
CT HEAD WITHOUT CONTRAST
CT CERVICAL SPINE WITHOUT CONTRAST
TECHNIQUE: Multidetector CT imaging of the head and cervical spine was
performed following the standard protocol without intravenous
contrast. Multiplanar CT image reconstructions of the cervical spine
were also generated.

[Series 3: head without · axial · non-contrast · 0.46mm/px · z∈[-148,+12]mm · 3 of 33 slices shown, 4 images]
[im 1/33  brain]
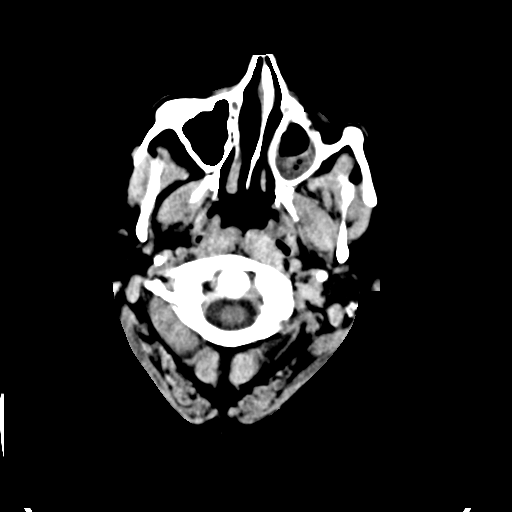
[im 1/33  bone]
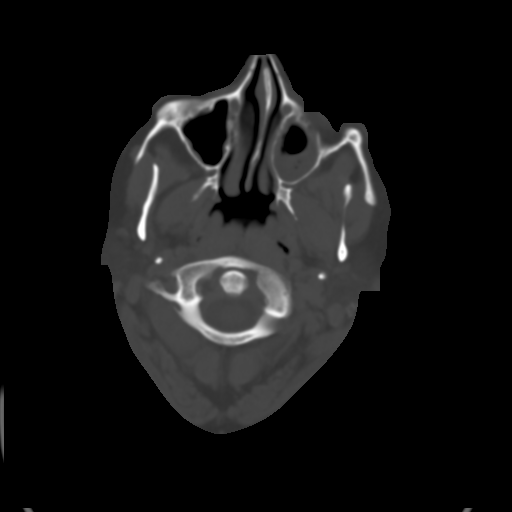
[im 17/33  brain]
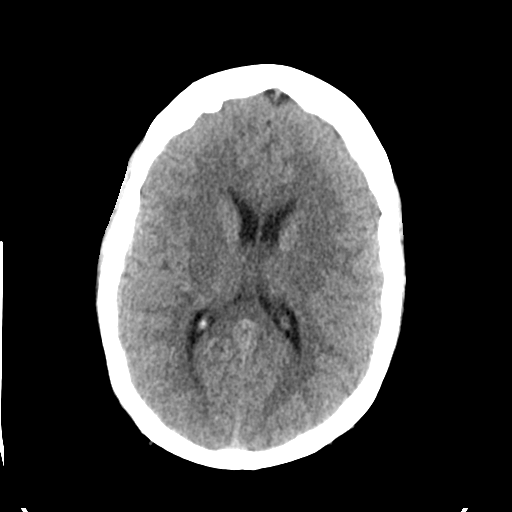
[im 33/33  brain]
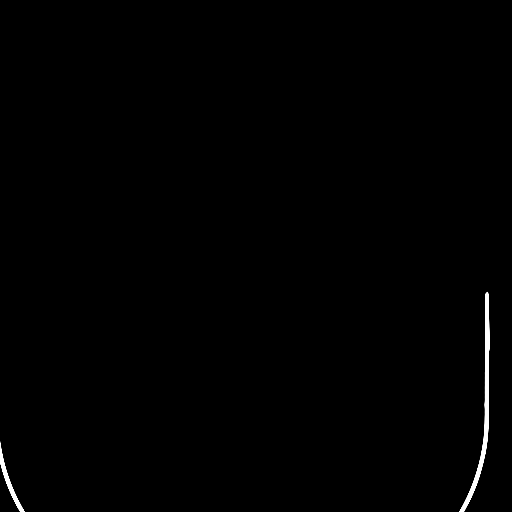

[Series 4: head bone · axial · 0.46mm/px · z∈[-126,-8]mm · 6 of 83 slices shown]
[im 12/83  bone]
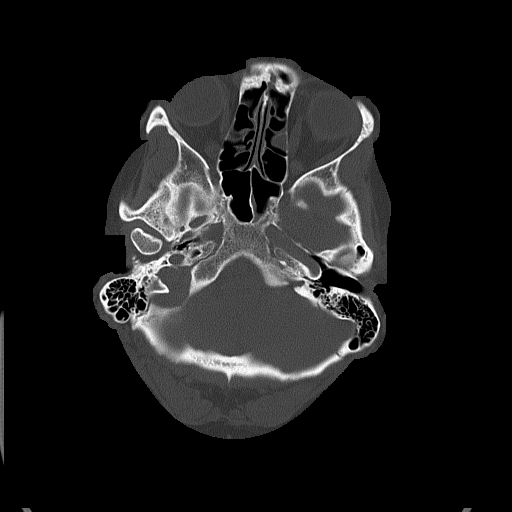
[im 24/83  bone]
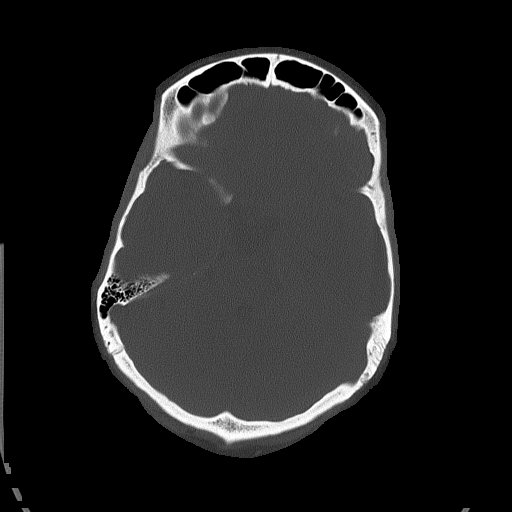
[im 36/83  bone]
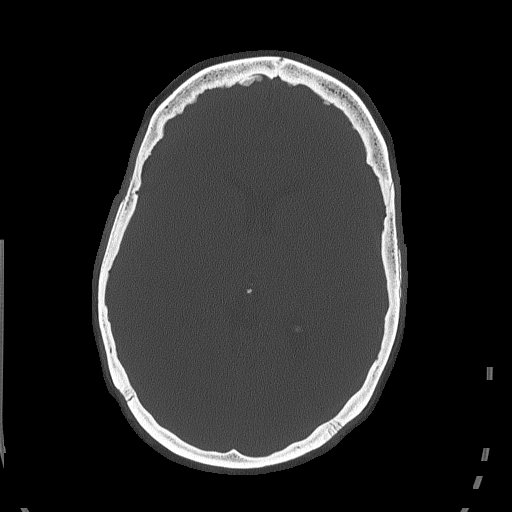
[im 47/83  bone]
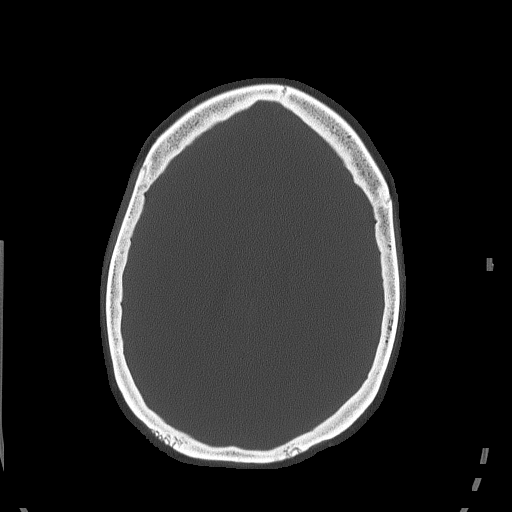
[im 59/83  bone]
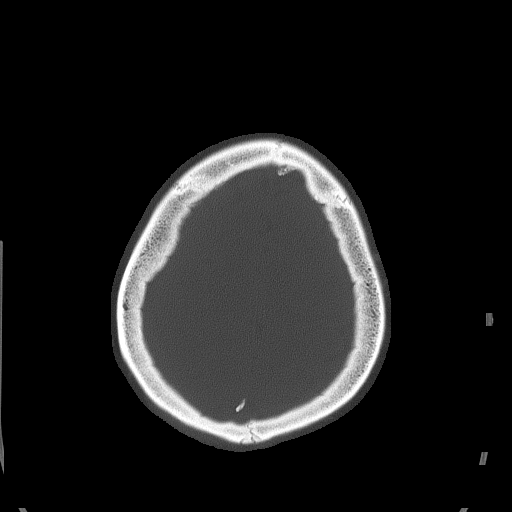
[im 71/83  bone]
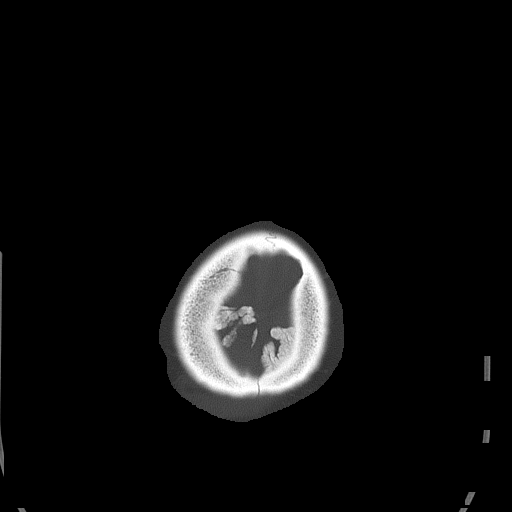

[Series 5: head without cor · coronal · non-contrast · 0.32mm/px · 3 of 67 slices shown]
[im 14/67  brain]
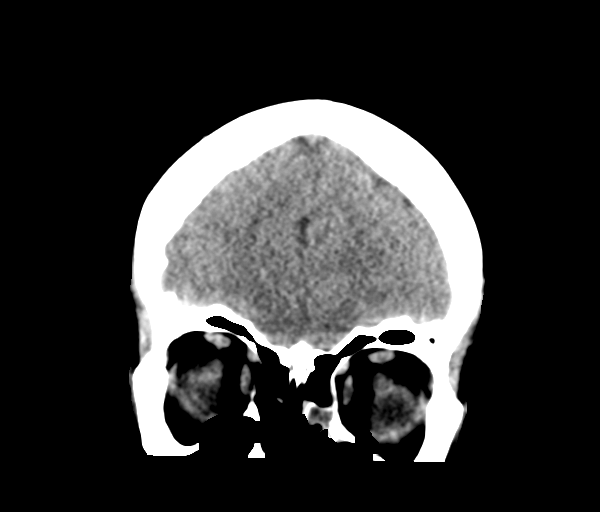
[im 27/67  brain]
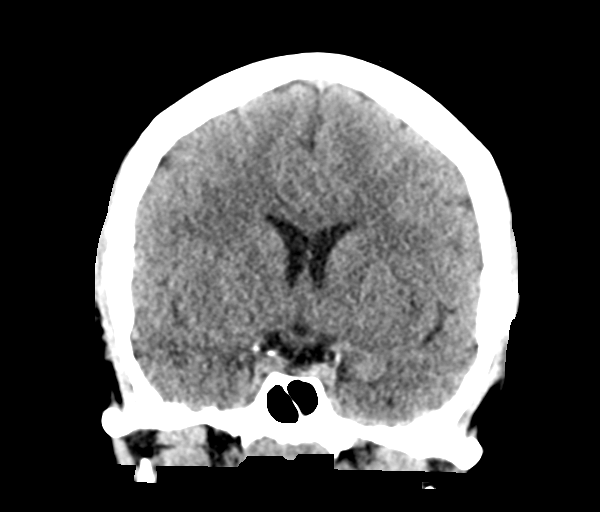
[im 40/67  brain]
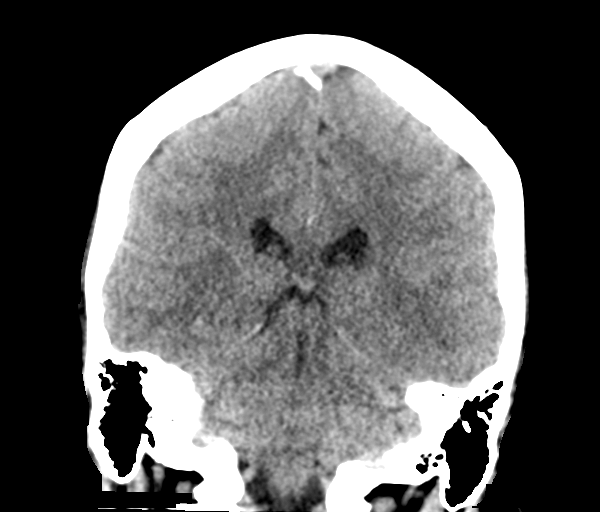

[Series 7: head without sag · sagittal · non-contrast · 0.36mm/px · 1 of 50 slices shown]
[im 25/50  brain]
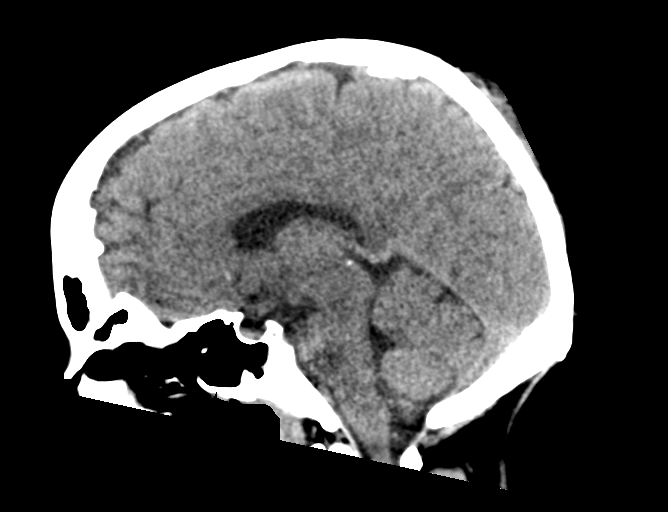

[Series 8: c_spine 2.0 st · axial · 0.32mm/px · z∈[-310,-240]mm · 4 of 93 slices shown]
[im 12/93  brain]
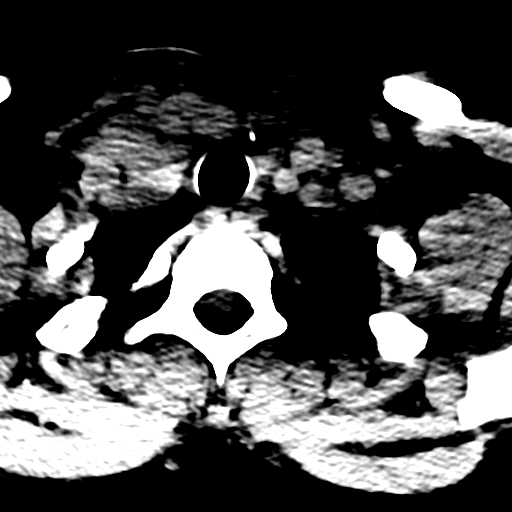
[im 24/93  brain]
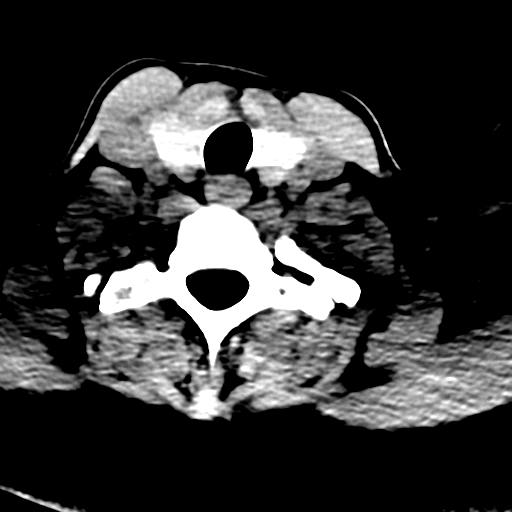
[im 35/93  brain]
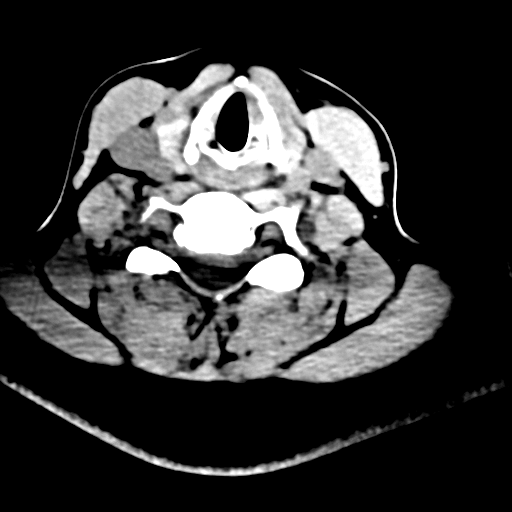
[im 47/93  brain]
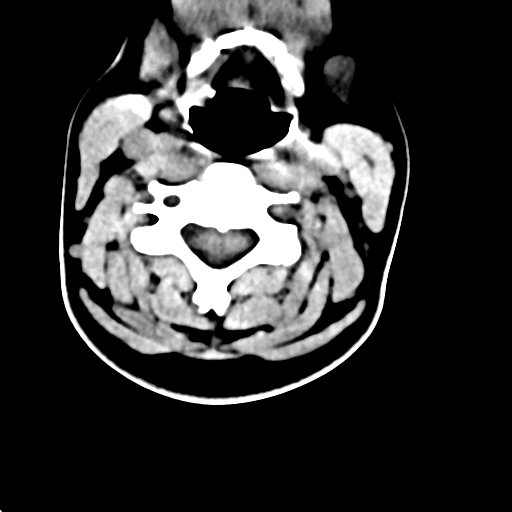

[17 of 47 positions shown; findings below may reference images not displayed]

FINDINGS: CT HEAD FINDINGS

BRAIN: No intraparenchymal hemorrhage, mass effect nor midline
shift. The ventricles and sulci are normal. No acute large vascular
territory infarcts. No abnormal extra-axial fluid collections. Basal
cisterns are patent.

VASCULAR: Unremarkable.

SKULL/SOFT TISSUES: No skull fracture. Mild posterior vertex scalp
hematoma without subcutaneous gas or radiopaque foreign bodies.

ORBITS/SINUSES: The included ocular globes and orbital contents are
normal.LEFT maxillary sinus mucosal thickening, mild ethmoid,
sphenoid mucosal thickening. Mastoid air cells are well aerated.

OTHER: None.

CT CERVICAL SPINE FINDINGS

ALIGNMENT: Straightened lordosis. Vertebral bodies in alignment.
Mild broad dextroscoliosis, may be positional.

SKULL BASE AND VERTEBRAE: Cervical vertebral bodies and posterior
elements are intact. RIGHT C3-4 uncovertebral hypertrophy.
Intervertebral disc heights preserved. No destructive bony lesions.
C1-2 articulation maintained.

SOFT TISSUES AND SPINAL CANAL: Normal.

DISC LEVELS: No significant osseous canal stenosis or neural
foraminal narrowing.

UPPER CHEST: Lung apices are clear.

OTHER: None.
IMPRESSION: 1. Mild vertex scalp hematoma, otherwise negative noncontrast CT
HEAD.
2. Negative noncontrast CT CERVICAL SPINE.
